# Patient Record
Sex: Male | Born: 1995 | Race: White | Hispanic: No | Marital: Single | Smoking: Current every day smoker
Health system: Southern US, Community
[De-identification: ages and names within clinical notes are randomized; demographics above are authoritative.]

---
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
## 2016-07-14 IMAGING — CR DG TIBIA/FIBULA 2V*R*
2 series · 2 of 2 positions shown · non-contrast
Comparison: None.

CLINICAL DATA: Right knee and lower leg pain after fall. Skateboard
accident today. Initial encounter.

EXAM:
RIGHT TIBIA AND FIBULA - 2 VIEW

[x tib-fib ap right]
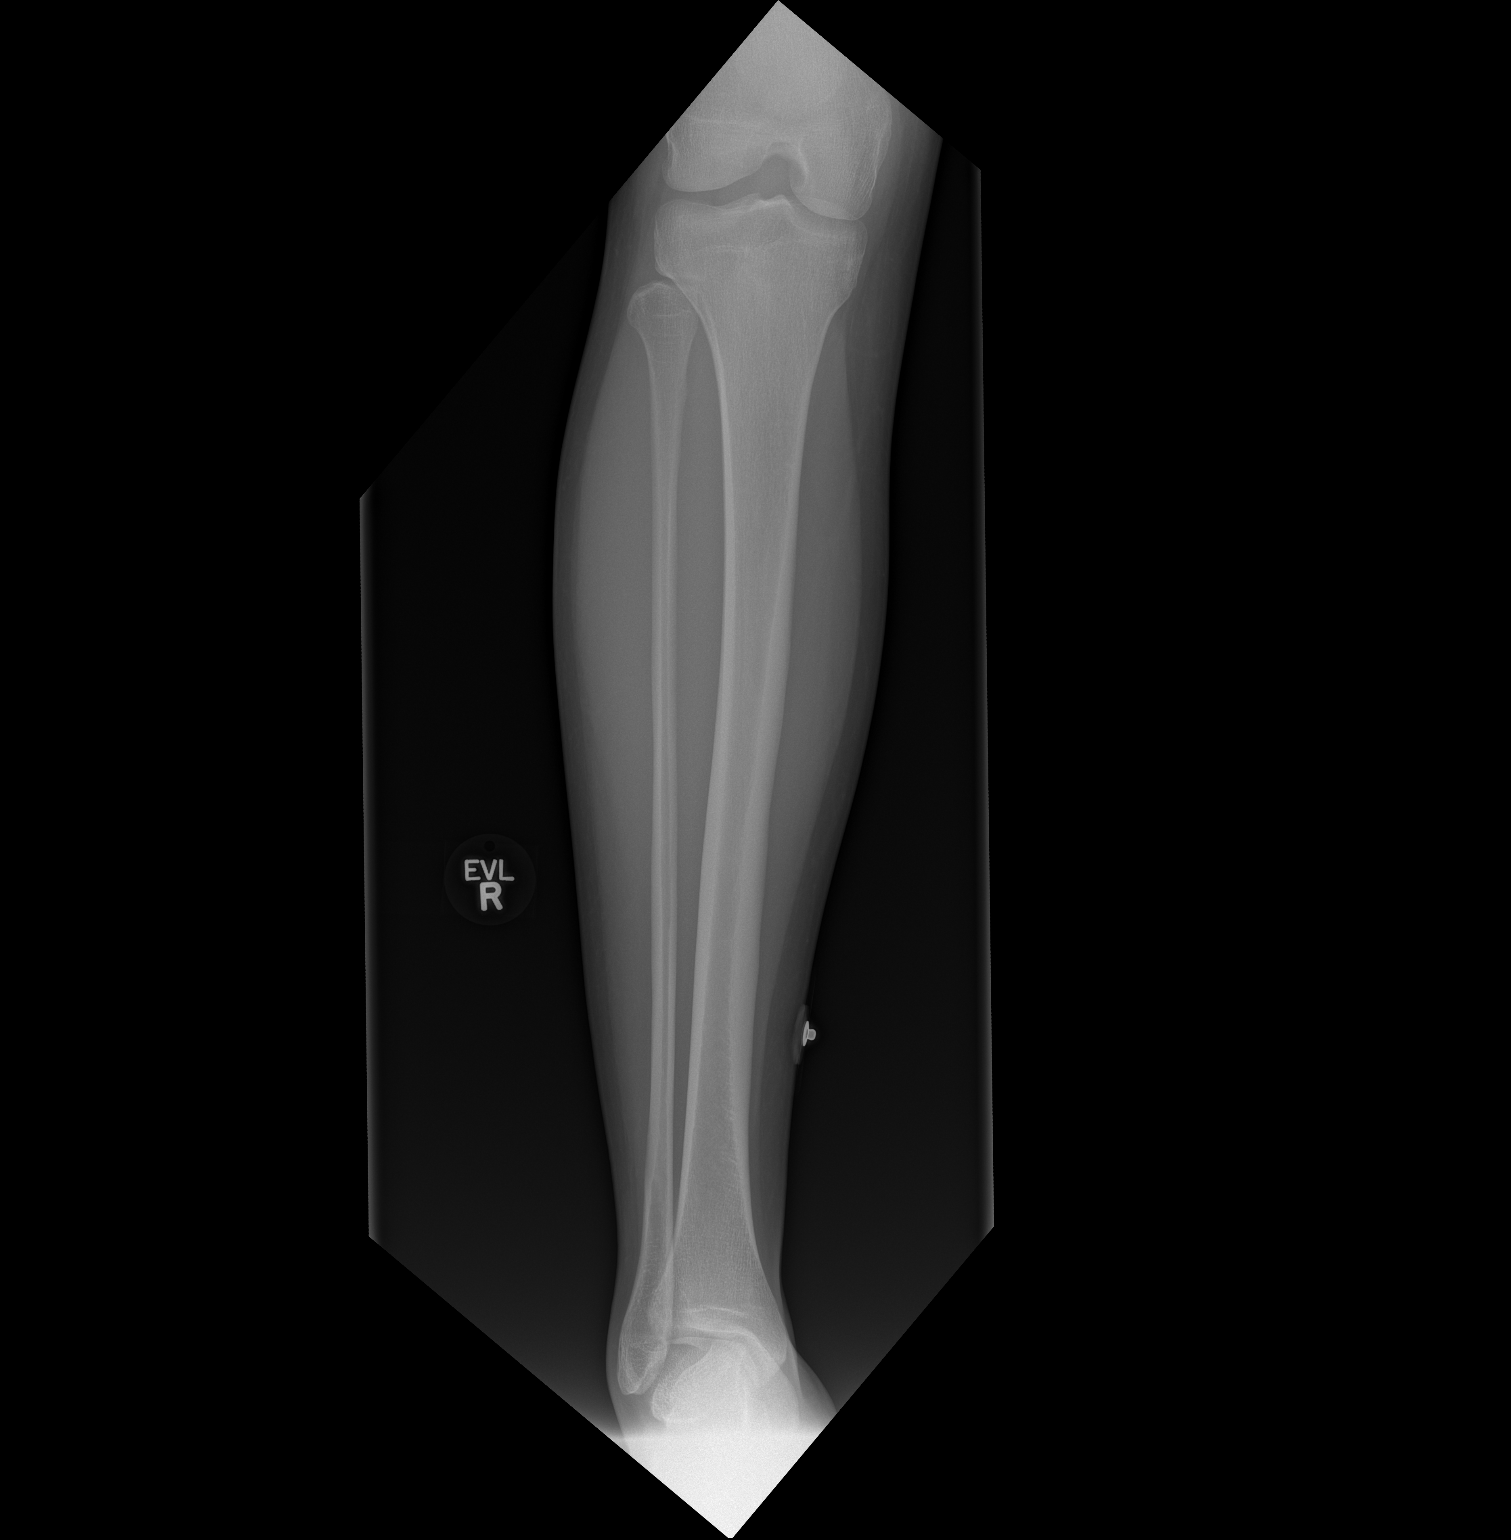

[x tib-fib lat right]
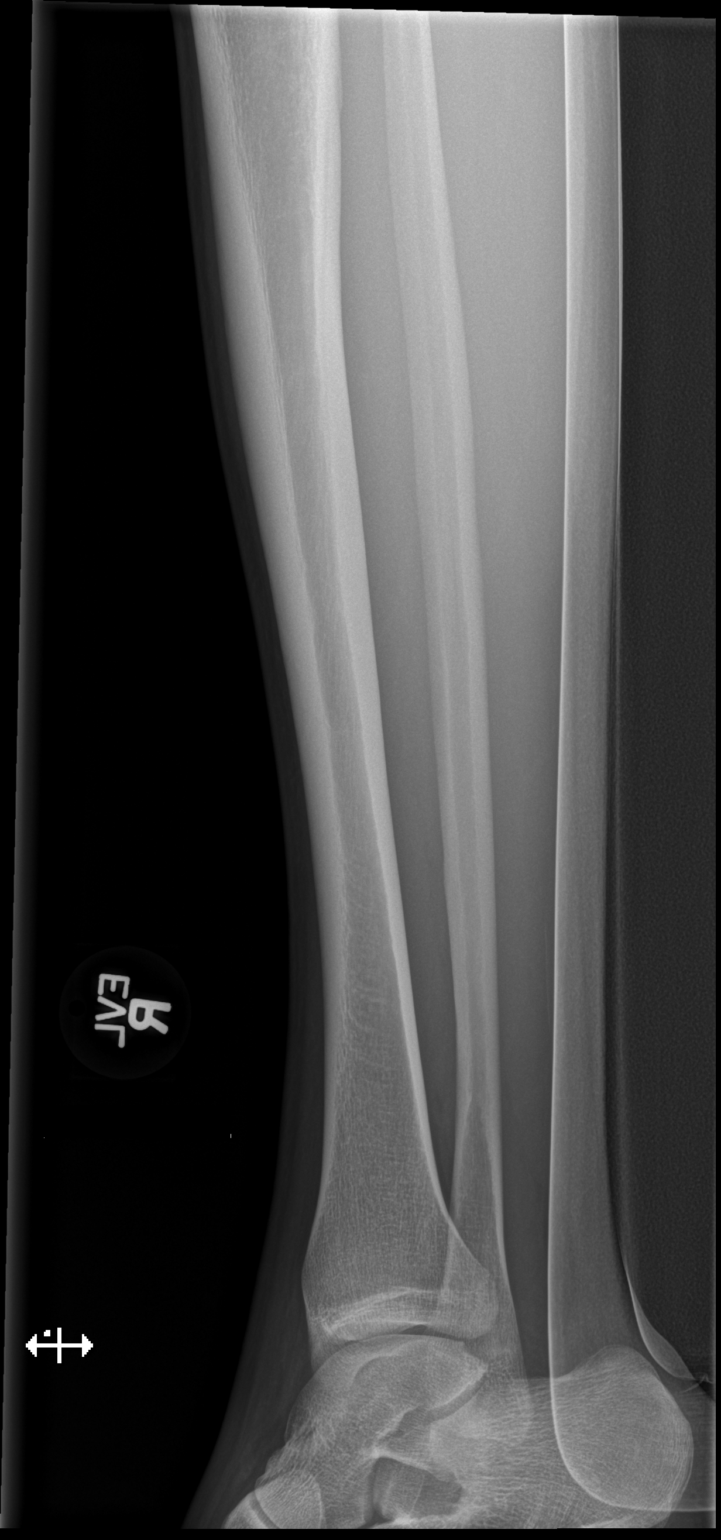

[2 of 2 positions shown; findings below may reference images not displayed]

FINDINGS: Cortical margins of the tibia and fibula are intact. There is no
fracture. Ankle alignment is maintained.
IMPRESSION: Negative radiographs of the right tibia/fibula.
